# Patient Record
Sex: Male | Born: 1960 | Race: Black or African American | Hispanic: No | Marital: Single | Smoking: Never smoker
Health system: Southern US, Community
[De-identification: ages and names within clinical notes are randomized; demographics above are authoritative.]

## PROBLEM LIST (undated history)

## (undated) DIAGNOSIS — E119 Type 2 diabetes mellitus without complications: Secondary | ICD-10-CM

## (undated) HISTORY — PX: BACK SURGERY: SHX140

---

## 2018-05-27 ENCOUNTER — Emergency Department (HOSPITAL_COMMUNITY): Payer: Self-pay

## 2018-05-27 ENCOUNTER — Other Ambulatory Visit: Payer: Self-pay

## 2018-05-27 ENCOUNTER — Emergency Department (HOSPITAL_COMMUNITY)
Admission: EM | Admit: 2018-05-27 | Discharge: 2018-05-27 | Disposition: A | Discharge status: Home / Self Care | Payer: Self-pay | Attending: Emergency Medicine | Admitting: Emergency Medicine

## 2018-05-27 ENCOUNTER — Encounter (HOSPITAL_COMMUNITY): Payer: Self-pay | Admitting: Emergency Medicine

## 2018-05-27 DIAGNOSIS — M546 Pain in thoracic spine: Secondary | ICD-10-CM | POA: Insufficient documentation

## 2018-05-27 DIAGNOSIS — R030 Elevated blood-pressure reading, without diagnosis of hypertension: Secondary | ICD-10-CM | POA: Insufficient documentation

## 2018-05-27 LAB — BASIC METABOLIC PANEL
Anion gap: 11 (ref 5–15)
BUN: 9 mg/dL (ref 6–20)
CO2: 23 mmol/L (ref 22–32)
Calcium: 9.6 mg/dL (ref 8.9–10.3)
Chloride: 103 mmol/L (ref 98–111)
Creatinine, Ser: 1.03 mg/dL (ref 0.61–1.24)
GFR calc non Af Amer: >60 mL/min (ref >60)
Glucose, Bld: 116 mg/dL — ABNORMAL HIGH (ref 70–99)
Potassium: 3.8 mmol/L (ref 3.5–5.1)
Sodium: 137 mmol/L (ref 135–145)

## 2018-05-27 LAB — CBC
HCT: 41.5 % (ref 39.0–52.0)
Hemoglobin: 13.5 g/dL (ref 13.0–17.0)
MCH: 29.2 pg (ref 26.0–34.0)
MCHC: 32.5 g/dL (ref 30.0–36.0)
MCV: 89.8 fL (ref 80.0–100.0)
NRBC: 0 % (ref 0.0–0.2)
Platelets: 307 10*3/uL (ref 150–400)
RBC: 4.62 MIL/uL (ref 4.22–5.81)
RDW: 14 % (ref 11.5–15.5)
WBC: 8.9 10*3/uL (ref 4.0–10.5)

## 2018-05-27 LAB — I-STAT TROPONIN, ED: Troponin i, poc: 0 ng/mL (ref 0.00–0.08)

## 2018-05-27 LAB — D-DIMER, QUANTITATIVE (NOT AT ARMC)

## 2018-05-27 MED ORDER — METHOCARBAMOL 500 MG PO TABS: 1000.0000 mg | ORAL_TABLET | Freq: Four times a day (QID) | ORAL | 0 refills | Status: DC

## 2018-05-27 NOTE — Discharge Instructions (Signed)
Please read and follow all provided instructions.  Your diagnoses today include:  1. Acute right-sided thoracic back pain   2. Elevated blood pressure reading     Tests performed today include:  An EKG of your heart  A chest x-ray  Cardiac enzymes - a blood test for heart muscle damage  Blood counts and electrolytes  D-dimer -screening test for blood clot was negative  Vital signs. See below for your results today.   Medications prescribed:   Robaxin (methocarbamol) - muscle relaxer medication  DO NOT drive or perform any activities that require you to be awake and alert because this medicine can make you drowsy.   Take any prescribed medications only as directed.  Follow-up instructions: Please follow-up with your primary care provider in the next week for further evaluation of your symptoms.   Return instructions:  SEEK IMMEDIATE MEDICAL ATTENTION IF:  You have severe chest pain, especially if the pain is crushing or pressure-like and spreads to the arms, back, neck, or jaw, or if you have sweating, nausea (feeling sick to your stomach), or shortness of breath. THIS IS AN EMERGENCY. Don't wait to see if the pain will go away. Get medical help at once. Call 911 or 0 (operator). DO NOT drive yourself to the hospital.   Your chest pain gets worse and does not go away with rest.   You have an attack of chest pain lasting longer than usual, despite rest and treatment with the medications your caregiver has prescribed.   You wake from sleep with chest pain or shortness of breath.  You feel dizzy or faint.  You have chest pain not typical of your usual pain for which you originally saw your caregiver.   You have any other emergent concerns regarding your health.  Additional Information: Chest pain comes from many different causes. Your caregiver has diagnosed you as having chest pain that is not specific for one problem, but does not require admission.  You are at low  risk for an acute heart condition or other serious illness.   Your vital signs today were: BP (!) 137/98    Pulse 66    Temp 98.5 F (36.9 C) (Oral)    Resp (!) 23    SpO2 95%  If your blood pressure (BP) was elevated above 135/85 this visit, please have this repeated by your doctor within one month. --------------

## 2018-05-27 NOTE — ED Provider Notes (Signed)
[facility] Provider Note   CSN: [id number] Arrival date & time: 05/27/18  1735     History   Chief Complaint Chief Complaint  Patient presents with  . Shortness of Breath  . Back Pain    HPI Guy Sullivan is a 57 y.o. male.  Patient presents the emergency department today with complaint of right subscapular back pain ongoing over the past 9 days.  Patient is from [place] and recently traveled to [place] on the bus.  He denies any history of blood clots or lower extremity pain or swelling.  He states that when he moves or takes a deep breath in the pain catches him and he cannot take a full breath.  He denies any chest pain or abdominal pain.  He did not present earlier because he thought he just had gas however his symptoms were worse today causing him to come to the emergency department.  He denies any cough, hemoptysis, shortness of breath.  No fevers or cold/flu symptoms.  No vomiting or diarrhea.  No treatments prior to arrival.     History reviewed. No pertinent past medical history.  There are no active problems to display for this patient.   Past Surgical History:  Procedure Laterality Date  . BACK SURGERY          Home Medications    Prior to Admission medications   Not on File    Family History No family history on file.  Social History Social History   Tobacco Use  . Smoking status: Never Smoker  . Smokeless tobacco: Never Used  Substance Use Topics  . Alcohol use: Not Currently  . Drug use: Not Currently     Allergies   Patient has no allergy information on record.   Review of Systems Review of Systems  Constitutional: Negative for fever.  HENT: Negative for rhinorrhea and sore throat.   Eyes: Negative for redness.  Respiratory: Positive for shortness of breath. Negative for cough.   Cardiovascular: Negative for chest pain.  Gastrointestinal: Negative for abdominal pain, diarrhea, nausea  and vomiting.  Genitourinary: Negative for dysuria.  Musculoskeletal: Positive for back pain. Negative for myalgias.  Skin: Negative for rash.  Neurological: Negative for headaches.     Physical Exam Updated Vital Signs BP (!) 164/131 (BP Location: Right Arm)   Pulse 100   Temp 98.5 F (36.9 C) (Oral)   Resp 20   SpO2 99%   Physical Exam Vitals signs and nursing note reviewed.  Constitutional:      Appearance: He is well-developed.  HENT:     Head: Normocephalic and atraumatic.  Eyes:     General:        Right eye: No discharge.        Left eye: No discharge.     Conjunctiva/sclera: Conjunctivae normal.  Neck:     Musculoskeletal: Normal range of motion and neck supple.  Cardiovascular:     Rate and Rhythm: Normal rate and regular rhythm.     Heart sounds: Normal heart sounds.  Pulmonary:     Effort: Pulmonary effort is normal.     Breath sounds: Normal breath sounds.  Abdominal:     Palpations: Abdomen is soft.     Tenderness: There is no abdominal tenderness.  Musculoskeletal:     Cervical back: He exhibits normal range of motion, no tenderness and no bony tenderness.     Thoracic back: He exhibits tenderness. He exhibits normal range  of motion and no bony tenderness.     Lumbar back: He exhibits normal range of motion, no tenderness and no bony tenderness.       Back:     Right lower leg: He exhibits no tenderness. No edema.     Left lower leg: He exhibits no tenderness. No edema.     Comments: No objective clinical signs and symptoms of DVT.  Skin:    General: Skin is warm and dry.  Neurological:     Mental Status: He is alert.      ED Treatments / Results  Labs (all labs ordered are listed, but only abnormal results are displayed) Labs Reviewed  BASIC METABOLIC PANEL - Abnormal; Notable for the following components:      Result Value   Glucose, Bld 116 (*)    All other components within normal limits  CBC  D-DIMER, QUANTITATIVE (NOT AT [facility])    I-STAT TROPONIN, ED    EKG EKG Interpretation  [facility]/Time:  Sunday May 27 2018 17:46:21 EST Ventricular Rate:  94 PR Interval:  138 QRS Duration: 102 QT Interval:  348 QTC Calculation: 435 R Axis:   49 Text Interpretation:  Normal sinus rhythm T wave abnormality, consider inferior ischemia Abnormal ECG no prior available for comparison Confirmed by Tilden Fossaees, Elizabeth [phone]([id number]) on 05/27/2018 7:19:03 PM   Radiology Dg Chest 2 View  Result Date: 05/27/2018 CLINICAL DATA:  Chest pain EXAM: CHEST - 2 VIEW COMPARISON:  None. FINDINGS: Normal heart size. Normal mediastinal contour. No pneumothorax. No pleural effusion. Mild biapical pleural-parenchymal scarring. No pulmonary edema. No acute consolidative airspace disease. IMPRESSION: No active cardiopulmonary disease. Electronically Signed   By: Delbert PhenixJason A Poff M.D.   On: 05/27/2018 19:18    Procedures Procedures (including critical care time)  Medications Ordered in ED Medications - No data to display   Initial Impression / Assessment and Plan / ED Course  I have reviewed the triage vital signs and the nursing notes.  Pertinent labs & imaging results that were available during my care of the patient were reviewed by me and considered in my medical decision making (see chart for details).     Patient seen and examined.  May be musculoskeletal as patient is tender over the area, however will need evaluation for cardiopulmonary causes of pain including PE.  Patient on his way to his chest x-ray at the end of my exam.  Vital signs reviewed and are as follows: BP (!) 164/131 (BP Location: Right Arm)   Pulse 100   Temp 98.5 F (36.9 C) (Oral)   Resp 20   SpO2 99%   9:01 PM EKG reviewed with Dr. Madilyn Hookees.  It is abnormal but is probably chronic.  Again, patient without any chest pain.  Symptoms ongoing for 9 days and they are pleuritic in nature.  Symptoms would be very atypical for ACS.  In the setting, troponin is 0.00.  D-dimer is  negative.  Chest x-ray is negative.  I informed patient of results and reexamined him.  He continues to be tender with palpation at the inferior aspect of the scapula.  He winces in pain when you push on this area or when he moves his right upper extremity.  At this point advise Tylenol, Robaxin if desired, heat on the area.  Encouraged PCP follow-up if symptoms continue for more than a week.  He states that he is returning home to [place] shortly after the new year.  We also discussed his elevated  blood pressure readings here.  Patient states that he checks his blood pressure regularly and has never had high blood pressure before.  He has an appointment with his doctor in February.  I advised him to continue monitoring his blood pressure and take a list of his blood pressure readings to his doctor for review.  Encourage patient to return with changing or worsening symptoms including chest pain, persistent shortness of breath, vomiting, new symptoms or other concerns.  He verbalizes understanding and agrees with plan.  He seems reliable to return with worsening.  BP (!) 129/112 (BP Location: Right Arm)   Pulse 81   Temp 98.5 F (36.9 C) (Oral)   Resp (!) 24   SpO2 96%    Final Clinical Impressions(s) / ED Diagnoses   Final diagnoses:  Acute right-sided thoracic back pain  Elevated blood pressure reading   Patient with thoracic back pain, worse with deep breathing, making it difficult for him to take deep breaths.  Pain is reproducible with palpation of the back.  Patient had negative troponin and negative d-dimer.  No hypoxia or persistent tachycardia.  He does not have any chest pain or other symptoms to strongly suggest ACS or dissection.  Given his clinical exam and results obtained tonight, most likely explanation is musculoskeletal in nature as symptoms are readily reproducible.  Treatment plan as above.  Return instructions as above.  Patient is in no distress at time of discharge and  is well-appearing.  ED Discharge Orders         Ordered    methocarbamol (ROBAXIN) 500 MG tablet  4 times daily     05/27/18 2058           Renne Crigler, PA-C 05/27/18 2106    Tilden Fossa, MD 05/28/18 [phone]

## 2018-05-27 NOTE — ED Triage Notes (Signed)
C/o SOB and sharp pain under R shoulder blade x 9 days.  Pain worse with deep inspiration.

## 2018-05-27 NOTE — ED Notes (Signed)
Patient verbalizes understanding of discharge instructions. Opportunity for questioning and answers were provided. 

## 2019-04-04 ENCOUNTER — Ambulatory Visit
Admission: EM | Admit: 2019-04-04 | Discharge: 2019-04-04 | Disposition: A | Discharge status: Home / Self Care | Payer: PRIVATE HEALTH INSURANCE | Attending: Physician Assistant | Admitting: Physician Assistant

## 2019-04-04 ENCOUNTER — Encounter: Payer: Self-pay | Admitting: *Deleted

## 2019-04-04 DIAGNOSIS — E119 Type 2 diabetes mellitus without complications: Secondary | ICD-10-CM

## 2019-04-04 LAB — POCT URINALYSIS DIP (MANUAL ENTRY)
Bilirubin, UA: NEGATIVE
Blood, UA: NEGATIVE
Glucose, UA: >=1000 mg/dL — AB
Leukocytes, UA: NEGATIVE
Nitrite, UA: NEGATIVE
Protein Ur, POC: NEGATIVE mg/dL
Spec Grav, UA: 1.015 (ref 1.010–1.025)
Urobilinogen, UA: 0.2 E.U./dL
pH, UA: 7 (ref 5.0–8.0)

## 2019-04-04 LAB — POCT FASTING CBG KUC MANUAL ENTRY
POCT Glucose (KUC): 481 mg/dL — AB (ref 70–99)
POCT Glucose (KUC): 424 mg/dL — AB (ref 70–99)

## 2019-04-04 MED ORDER — METFORMIN HCL 500 MG PO TABS: 500.0000 mg | ORAL_TABLET | Freq: Two times a day (BID) | ORAL | 0 refills | Status: DC

## 2019-04-04 MED ORDER — SODIUM CHLORIDE 0.9 % IV BOLUS
1000.0000 mL | Freq: Once | INTRAVENOUS | Status: AC
  Administered 2019-04-04: 1000 mL via INTRAVENOUS

## 2019-04-04 NOTE — ED Provider Notes (Signed)
[facility]    CSN: [id number] Arrival date & time: 04/04/19  1402      History   Chief Complaint Chief Complaint  Patient presents with  . Polyuria    HPI Guy Sullivan is a 58 y.o. male.   58 year old male comes in for few month history of polyuria.  He has had polyuria, polydipsia, weakness, dizziness, losing balance, lightheadedness, fatigue.  He denies syncope.  He has had to take breaks while he is walking due to the weakness and dizziness.  He denies confusion.  Denies fever, chills, body aches.  Denies abdominal pain, nausea, vomiting, diarrhea.  States mother and sister both have DM 2, and checked his blood sugar.  Took blood sugar today with 149, therefore came in for evaluation.  Patient lives in [place], is in [place] for family.  He still plans to see primary care in [place].  Last seen 10/2018. Denies history of HTN, HLD, CKD. Unknown if a1c ever checked.      History reviewed. No pertinent past medical history.  There are no active problems to display for this patient.   Past Surgical History:  Procedure Laterality Date  . BACK SURGERY         Home Medications    Prior to Admission medications   Medication Sig Start Date End Date Taking? Authorizing Provider  metFORMIN (GLUCOPHAGE) 500 MG tablet Take 1 tablet (500 mg total) by mouth 2 (two) times daily with a meal. 04/04/19   Ok Edwards, PA-C    Family History Family History  Problem Relation Age of Onset  . Diabetes Mother     Social History Social History   Tobacco Use  . Smoking status: Never Smoker  . Smokeless tobacco: Never Used  Substance Use Topics  . Alcohol use: Not Currently  . Drug use: Not Currently     Allergies   Patient has no known allergies.   Review of Systems Review of Systems  Reason unable to perform ROS: See HPI as above.     Physical Exam Triage Vital Signs ED Triage Vitals  Enc Vitals Group     BP 04/04/19 1456 105/60     Pulse Rate  04/04/19 1456 97     Resp 04/04/19 1456 18     Temp 04/04/19 1456 98.6 F (37 C)     Temp Source 04/04/19 1456 Oral     SpO2 04/04/19 1456 99 %     Weight --      Height --      Head Circumference --      Peak Flow --      Pain Score 04/04/19 1449 0     Pain Loc --      Pain Edu? --      Excl. in [place]? --    No data found.  Updated Vital Signs BP (!) 133/93 (BP Location: Left Arm)   Pulse 69   Temp 98.6 F (37 C) (Oral)   Resp 18   SpO2 98%   Physical Exam Constitutional:      General: He is not in acute distress.    Appearance: Normal appearance. He is well-developed.  HENT:     Head: Normocephalic and atraumatic.     Mouth/Throat:     Mouth: Mucous membranes are moist.     Pharynx: Oropharynx is clear.  Eyes:     Extraocular Movements: Extraocular movements intact.     Conjunctiva/sclera: Conjunctivae normal.  Pupils: Pupils are equal, round, and reactive to light.  Cardiovascular:     Rate and Rhythm: Normal rate and regular rhythm.     Heart sounds: Normal heart sounds. No murmur. No friction rub. No gallop.   Pulmonary:     Effort: Pulmonary effort is normal.     Breath sounds: Normal breath sounds. No wheezing or rales.  Abdominal:     General: Bowel sounds are normal.     Palpations: Abdomen is soft.     Tenderness: There is no abdominal tenderness. There is no right CVA tenderness, left CVA tenderness, guarding or rebound.  Skin:    General: Skin is warm and dry.  Neurological:     Mental Status: He is alert and oriented to person, place, and time.     GCS: GCS eye subscore is 4. GCS verbal subscore is 5. GCS motor subscore is 6.     Cranial Nerves: Cranial nerves are intact.     Sensory: Sensation is intact.     Motor: Motor function is intact.     Coordination: Coordination is intact.     Gait: Gait is intact.  Psychiatric:        Behavior: Behavior normal.        Judgment: Judgment normal.      UC Treatments / Results  Labs (all labs  ordered are listed, but only abnormal results are displayed) Labs Reviewed  POCT FASTING CBG [facility] MANUAL ENTRY - Abnormal; Notable for the following components:      Result Value   POCT Glucose ([facility]) 481 (*)    All other components within normal limits  POCT URINALYSIS DIP (MANUAL ENTRY) - Abnormal; Notable for the following components:   Glucose, UA >=1,000 (*)    Ketones, POC UA trace (5) (*)    All other components within normal limits  POCT FASTING CBG [facility] MANUAL ENTRY - Abnormal; Notable for the following components:   POCT Glucose ([facility]) 424 (*)    All other components within normal limits  CBC WITH DIFFERENTIAL/PLATELET  COMPREHENSIVE METABOLIC PANEL  HEMOGLOBIN A1C    EKG   Radiology No results found.  Procedures Procedures (including critical care time)  Medications Ordered in UC Medications  sodium chloride 0.9 % bolus 1,000 mL (0 mLs Intravenous Stopped 04/04/19 1624)    Initial Impression / Assessment and Plan / UC Course  I have reviewed the triage vital signs and the nursing notes.  Pertinent labs & imaging results that were available during my care of the patient were reviewed by me and considered in my medical decision making (see chart for details).    Discussed case with Dr Leonides Grills.  58 year old male with new onset diabetes.  CBG 481.  Dipstick with greater than 1000 glucose, trace ketones.  Patient without abdominal pain, nausea, vomiting, diarrhea.  He is at times weak, dizzy, and feels lightheaded.  BP 105/60, will provide fluids for possible dehydration.  At this time, lower suspicion for HHS/DKA although with trace ketones.  Will draw CBC, CMP, A1c for further evaluation.  Will start IV fluids, and reevaluate.  Patient with improvement of symptoms after IV fluids.  Increased blood pressure at 133/93, no longer dizzy or weak.  Given no history of CKD, will start patient on Metformin at this time.  Discussed diet changes to help with better glucose control.   We will follow up with patient after lab results return.  Patient to discuss current symptoms and diagnoses with PCP in [place], and to obtain further information for follow-up.  Return precautions given.  Patient expresses understanding and agrees to plan.  Patient discharged in stable condition pending lab results.  Final Clinical Impressions(s) / UC Diagnoses   Final diagnoses:  New onset type 2 diabetes mellitus [facility](HCC)   ED Prescriptions    Medication Sig Dispense Auth. Provider   metFORMIN (GLUCOPHAGE) 500 MG tablet Take 1 tablet (500 mg total) by mouth 2 (two) times daily with a meal. 60 tablet Belinda FisherYu, Amy V, PA-C     PDMP not reviewed this encounter.   Belinda FisherYu, Amy V, PA-C 04/04/19 1749

## 2019-04-04 NOTE — ED Triage Notes (Addendum)
Patient reports polyuria x a couple months, states he has to use restroom every 2 hours. Increased thirst, drinking a lot of water. Patient does drink sugary drinks and tea. Mother and sister have DM2, they asked patient to do CBG last night and was high. This afternoon took again and was 449. Patient is alert and oriented. Patient states last time he saw PCP was in June, states had labs but does not think he had HgbA1C. PCP is in [place].

## 2019-04-04 NOTE — Discharge Instructions (Signed)
Your blood pressure improved with fluids.  We have drawn labs to evaluate for your electrolytes, kidney function, liver function, diabetes status.  At this time, we will start you on Metformin to help bring down the sugar. Keep hydrated, urine should be clear to pale yellow in color.  If any worsening of symptoms, worsening dizziness/lightheadedness, now confused, or with abdominal pain, nausea, vomiting, go to the emergency department for further evaluation.  You will need close follow-up in the next few months to control your diabetes.  Please let your primary care know of current status, and follow-up with them were with new doctor in [place] for further evaluation and management needed.

## 2019-04-05 ENCOUNTER — Telehealth: Payer: Self-pay

## 2019-04-05 LAB — HEMOGLOBIN A1C
Est. average glucose Bld gHb Est-mCnc: 326 mg/dL
Hgb A1c MFr Bld: 13 % — ABNORMAL HIGH (ref 4.8–5.6)

## 2019-04-05 LAB — COMPREHENSIVE METABOLIC PANEL
ALT: 32 IU/L (ref 0–44)
AST: 25 IU/L (ref 0–40)
Albumin/Globulin Ratio: 1.7 (ref 1.2–2.2)
Albumin: 4.5 g/dL (ref 3.8–4.9)
Alkaline Phosphatase: 101 IU/L (ref 39–117)
BUN/Creatinine Ratio: 11 (ref 9–20)
BUN: 13 mg/dL (ref 6–24)
Bilirubin Total: 0.2 mg/dL (ref 0.0–1.2)
CO2: 21 mmol/L (ref 20–29)
Calcium: 9.8 mg/dL (ref 8.7–10.2)
Chloride: 97 mmol/L (ref 96–106)
Creatinine, Ser: 1.18 mg/dL (ref 0.76–1.27)
GFR calc Af Amer: 79 mL/min/{1.73_m2} (ref >59)
GFR calc non Af Amer: 69 mL/min/{1.73_m2} (ref >59)
Globulin, Total: 2.6 g/dL (ref 1.5–4.5)
Glucose: 476 mg/dL — ABNORMAL HIGH (ref 65–99)
Potassium: 4.7 mmol/L (ref 3.5–5.2)
Sodium: 134 mmol/L (ref 134–144)
Total Protein: 7.1 g/dL (ref 6.0–8.5)

## 2019-04-05 LAB — CBC WITH DIFFERENTIAL/PLATELET
Basophils Absolute: 0 10*3/uL (ref 0.0–0.2)
Basos: 1 %
EOS (ABSOLUTE): 0.1 10*3/uL (ref 0.0–0.4)
Eos: 1 %
Hematocrit: 42.6 % (ref 37.5–51.0)
Hemoglobin: 14.3 g/dL (ref 13.0–17.7)
Immature Grans (Abs): 0 10*3/uL (ref 0.0–0.1)
Immature Granulocytes: 0 %
Lymphocytes Absolute: 4.3 10*3/uL — ABNORMAL HIGH (ref 0.7–3.1)
Lymphs: 53 %
MCH: 30.4 pg (ref 26.6–33.0)
MCHC: 33.6 g/dL (ref 31.5–35.7)
MCV: 90 fL (ref 79–97)
Monocytes Absolute: 0.6 10*3/uL (ref 0.1–0.9)
Monocytes: 7 %
Neutrophils Absolute: 3.2 10*3/uL (ref 1.4–7.0)
Neutrophils: 38 %
Platelets: 315 10*3/uL (ref 150–450)
RBC: 4.71 x10E6/uL (ref 4.14–5.80)
RDW: 12.6 % (ref 11.6–15.4)
WBC: 8.3 10*3/uL (ref 3.4–10.8)

## 2019-04-08 ENCOUNTER — Telehealth: Payer: Self-pay | Admitting: Emergency Medicine

## 2019-04-08 NOTE — Telephone Encounter (Signed)
Glucose known during office visit. New onset diabetes, pt placed on metformin. Attempted to reach patient to encourage follow up with PCP. No answer.

## 2020-07-20 ENCOUNTER — Ambulatory Visit
Admission: EM | Admit: 2020-07-20 | Discharge: 2020-07-20 | Disposition: A | Discharge status: Home / Self Care | Payer: PRIVATE HEALTH INSURANCE | Attending: Physician Assistant | Admitting: Physician Assistant

## 2020-07-20 ENCOUNTER — Other Ambulatory Visit: Payer: Self-pay

## 2020-07-20 DIAGNOSIS — E1169 Type 2 diabetes mellitus with other specified complication: Secondary | ICD-10-CM

## 2020-07-20 DIAGNOSIS — I10 Essential (primary) hypertension: Secondary | ICD-10-CM | POA: Diagnosis not present

## 2020-07-20 DIAGNOSIS — R739 Hyperglycemia, unspecified: Secondary | ICD-10-CM

## 2020-07-20 HISTORY — DX: Type 2 diabetes mellitus without complications: E11.9

## 2020-07-20 LAB — POCT URINALYSIS DIP (MANUAL ENTRY)
Bilirubin, UA: NEGATIVE
Blood, UA: NEGATIVE
Glucose, UA: >=1000 mg/dL — AB
Leukocytes, UA: NEGATIVE
Nitrite, UA: NEGATIVE
Protein Ur, POC: 100 mg/dL — AB
Spec Grav, UA: 1.02 (ref 1.010–1.025)
Urobilinogen, UA: 0.2 E.U./dL
pH, UA: 5.5 (ref 5.0–8.0)

## 2020-07-20 LAB — POCT FASTING CBG KUC MANUAL ENTRY: POCT Glucose (KUC): 384 mg/dL — AB (ref 70–99)

## 2020-07-20 MED ORDER — METFORMIN HCL 1000 MG PO TABS: ORAL_TABLET | ORAL | 1 refills | Status: AC

## 2020-07-20 MED ORDER — LISINOPRIL 5 MG PO TABS: 5.0000 mg | ORAL_TABLET | Freq: Every day | ORAL | 0 refills | Status: AC

## 2020-07-20 NOTE — ED Provider Notes (Signed)
[facility]    CSN: [id number] Arrival date & time: 07/20/20  1311      History   Chief Complaint Chief Complaint  Patient presents with  . Hyperglycemia  . Hypertension    HPI Guy Sullivan is a 60 y.o. male.   Who carries a history of Type 2 DM followed in [place]. He presents with a feeling of "being loopy" x a few days. He states that he took his glucose at home and did not register so he came here. He is here visiting his Mom. He has been on Metformin since 2020, but was off for many months due to an issue with his pharmacy in [place]. He has been taking regularly since January. He has no chest pain, dyspnea or leg swelling. No dizziness or weakness is noted.      Past Medical History:  Diagnosis Date  . Diabetes mellitus without complication (HCC)     There are no problems to display for this patient.   Past Surgical History:  Procedure Laterality Date  . BACK SURGERY         Home Medications    Prior to Admission medications   Medication Sig Start Date End Date Taking? Authorizing Provider  metFORMIN (GLUCOPHAGE) 500 MG tablet Take 1 tablet (500 mg total) by mouth 2 (two) times daily with a meal. 04/04/19   Belinda Fisher, PA-C    Family History Family History  Problem Relation Age of Onset  . Diabetes Mother     Social History Social History   Tobacco Use  . Smoking status: Never Smoker  . Smokeless tobacco: Never Used  Substance Use Topics  . Alcohol use: Not Currently  . Drug use: Not Currently     Allergies   Patient has no known allergies.   Review of Systems Review of Systems  Constitutional: Positive for fatigue. Negative for fever.  Respiratory: Negative for cough and shortness of breath.   Cardiovascular: Negative for chest pain, palpitations and leg swelling.  Gastrointestinal: Negative.   Genitourinary: Negative for difficulty urinating and dysuria.       +polyuria  Skin: Negative.   Psychiatric/Behavioral: Negative.       Physical Exam Triage Vital Signs ED Triage Vitals  Enc Vitals Group     BP 07/20/20 1342 (!) 168/121     Pulse Rate 07/20/20 1342 (!) 116     Resp 07/20/20 1342 18     Temp 07/20/20 1342 98.7 F (37.1 C)     Temp Source 07/20/20 1342 Oral     SpO2 07/20/20 1342 96 %     Weight --      Height --      Head Circumference --      Peak Flow --      Pain Score 07/20/20 1343 0     Pain Loc --      Pain Edu? --      Excl. in [place]? --    No data found.  Updated Vital Signs BP (!) 168/121 (BP Location: Left Arm)   Pulse (!) 116   Temp 98.7 F (37.1 C) (Oral)   Resp 18   SpO2 96%   Visual Acuity Right Eye Distance:   Left Eye Distance:   Bilateral Distance:    Right Eye Near:   Left Eye Near:    Bilateral Near:     Physical Exam Vitals and nursing note reviewed.  Constitutional:      General: He is not  in acute distress.    Appearance: Normal appearance. He is normal weight. He is not ill-appearing, toxic-appearing or diaphoretic.  HENT:     Head: Normocephalic and atraumatic.  Cardiovascular:     Rate and Rhythm: Normal rate and regular rhythm.  Pulmonary:     Effort: Pulmonary effort is normal.     Breath sounds: Normal breath sounds. No stridor. No wheezing or rhonchi.  Musculoskeletal:     Cervical back: Normal range of motion.     Right lower leg: No edema.     Left lower leg: No edema.  Skin:    General: Skin is warm and dry.     Findings: No rash.  Neurological:     General: No focal deficit present.     Mental Status: He is alert.  Psychiatric:        Mood and Affect: Mood normal.        Behavior: Behavior normal.      UC Treatments / Results  Labs (all labs ordered are listed, but only abnormal results are displayed) Labs Reviewed  POCT FASTING CBG [facility] MANUAL ENTRY - Abnormal; Notable for the following components:      Result Value   POCT Glucose ([facility]) 384 (*)    All other components within normal limits    EKG   Radiology No  results found.  Procedures Procedures (including critical care time)  Medications Ordered in UC Medications - No data to display  Initial Impression / Assessment and Plan / UC Course  I have reviewed the triage vital signs and the nursing notes.  Pertinent labs & imaging results that were available during my care of the patient were reviewed by me and considered in my medical decision making (see chart for details).     1. HTN-New onset-Check CMP to ensure no renal insufficiency (patient not aware of any issues). Start Lisinopril at low dose for HTN and renal protection, but must f/u with PCP upon return to [place]. 2. DM (uncontrolled)-Increase Metformin to 1000 mg twice a day and continue with low carb diet. FU with PCP in [place] Any worsening symptoms then encouraged to f/u in the ED for further evaluation.  Final Clinical Impressions(s) / UC Diagnoses   Final diagnoses:  None   Discharge Instructions   None    ED Prescriptions    None     PDMP not reviewed this encounter.   Riki Sheer, [place] 07/20/20 [phone]

## 2020-07-20 NOTE — Discharge Instructions (Addendum)
For your diabetes will increase you from 1000 mg a day to 1000 mg twice a day. Also start Lisinopril for your blood pressure and also for kidney protection. Will follow up on your labs and let you know if there are any concerning signals. You can print this out online to take to your physician I [place] as well., Should you have any concerning symptoms with taking these medications or worsening then please go to the ED for further evaluation. Feel better.

## 2020-07-20 NOTE — ED Triage Notes (Signed)
Pt c/o "feeling loopy" for the past few days. States having decreased appetite and eyes cloudy. States they changed his DM meds a month ago but unable to check it at home. Denies hx of HTN, b/p 168/121 on arrival.

## 2020-07-21 LAB — COMPREHENSIVE METABOLIC PANEL
ALT: 31 IU/L (ref 0–44)
AST: 21 IU/L (ref 0–40)
Albumin/Globulin Ratio: 1.7 (ref 1.2–2.2)
Albumin: 4.8 g/dL (ref 3.8–4.9)
Alkaline Phosphatase: 99 IU/L (ref 44–121)
BUN/Creatinine Ratio: 15 (ref 9–20)
BUN: 17 mg/dL (ref 6–24)
Bilirubin Total: 0.6 mg/dL (ref 0.0–1.2)
CO2: 16 mmol/L — ABNORMAL LOW (ref 20–29)
Calcium: 10.3 mg/dL — ABNORMAL HIGH (ref 8.7–10.2)
Chloride: 97 mmol/L (ref 96–106)
Creatinine, Ser: 1.17 mg/dL (ref 0.76–1.27)
GFR calc Af Amer: 79 mL/min/{1.73_m2} (ref >59)
GFR calc non Af Amer: 68 mL/min/{1.73_m2} (ref >59)
Globulin, Total: 2.9 g/dL (ref 1.5–4.5)
Glucose: 371 mg/dL — ABNORMAL HIGH (ref 65–99)
Potassium: 4.7 mmol/L (ref 3.5–5.2)
Sodium: 135 mmol/L (ref 134–144)
Total Protein: 7.7 g/dL (ref 6.0–8.5)

## 2020-07-22 IMAGING — CR DG CHEST 2V
2 series · 2 of 2 positions shown · non-contrast
Comparison: None.

CLINICAL DATA: Chest pain

EXAM:
CHEST - 2 VIEW

[chest pa]
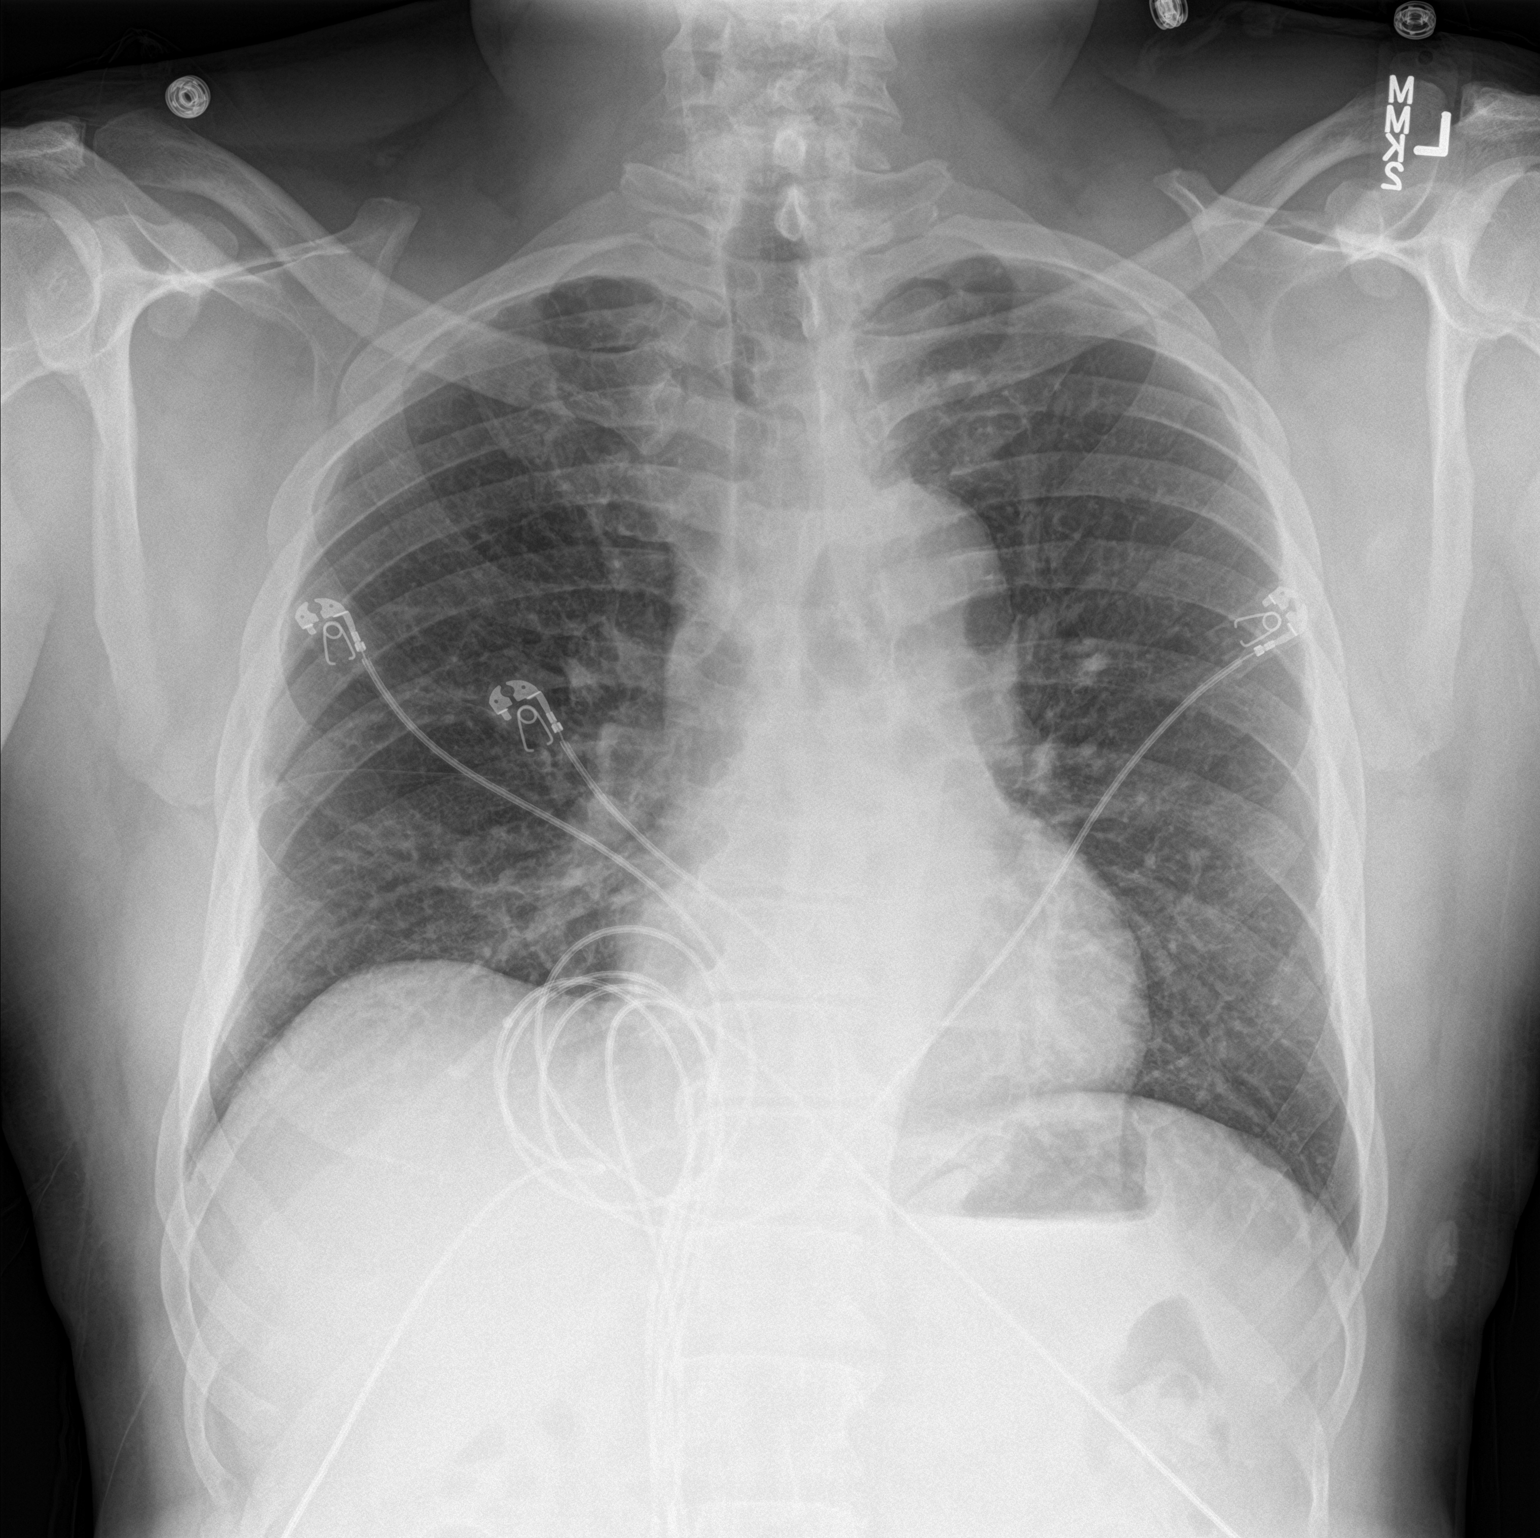

[chest lat]
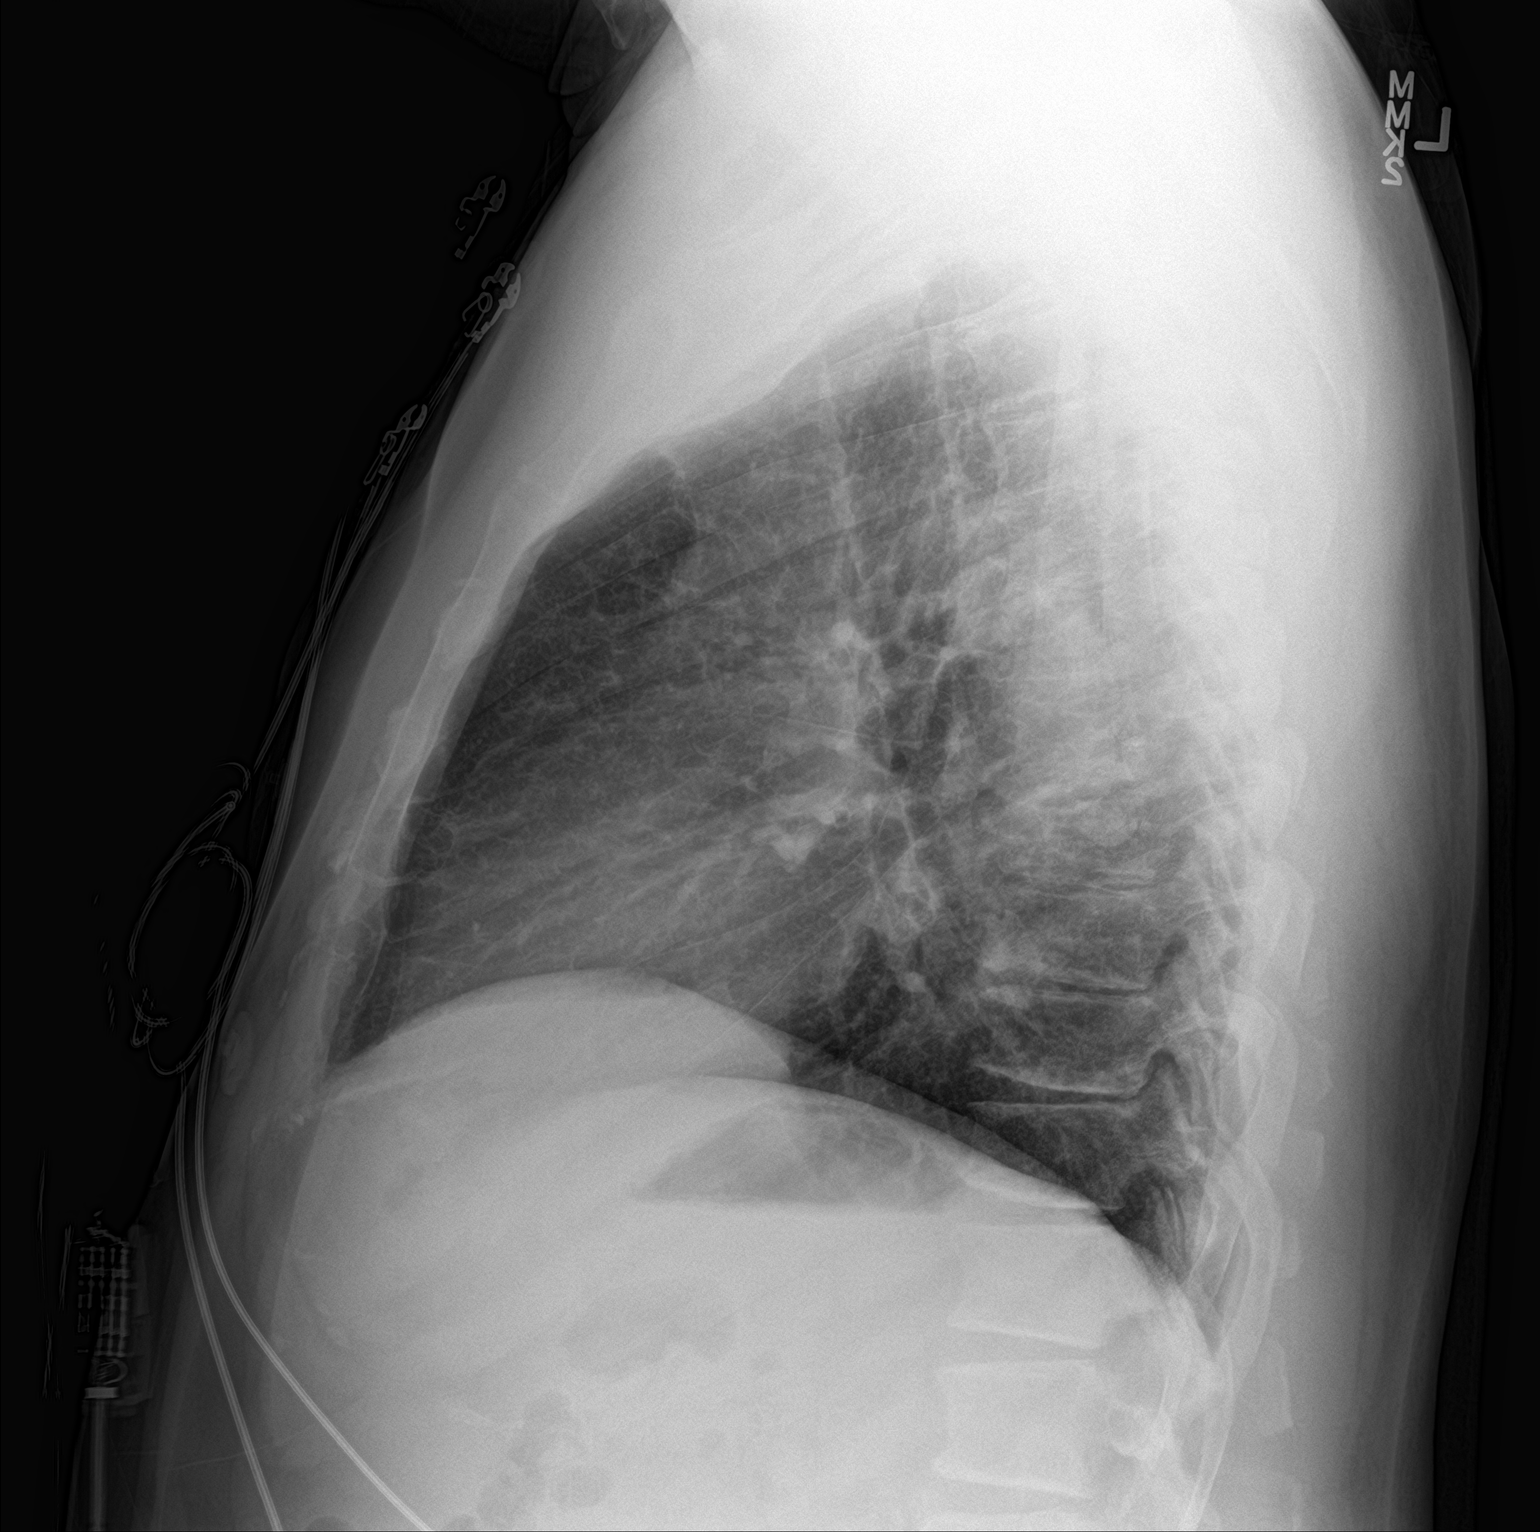

[2 of 2 positions shown; findings below may reference images not displayed]

FINDINGS: Normal heart size. Normal mediastinal contour. No pneumothorax. No
pleural effusion. Mild biapical pleural-parenchymal scarring. No
pulmonary edema. No acute consolidative airspace disease.
IMPRESSION: No active cardiopulmonary disease.
